# Patient Record
Sex: Female | Born: 1965 | Race: Black or African American | Hispanic: No | Marital: Single | Smoking: Never smoker
Health system: Southern US, Community
[De-identification: ages and names within clinical notes are randomized; demographics above are authoritative.]

## PROBLEM LIST (undated history)

## (undated) DIAGNOSIS — E119 Type 2 diabetes mellitus without complications: Secondary | ICD-10-CM

## (undated) DIAGNOSIS — I10 Essential (primary) hypertension: Secondary | ICD-10-CM

## (undated) DIAGNOSIS — Q858 Other phakomatoses, not elsewhere classified: Secondary | ICD-10-CM

## (undated) DIAGNOSIS — Q8589 Other phakomatoses, not elsewhere classified: Secondary | ICD-10-CM

## (undated) HISTORY — PX: ABDOMINAL HYSTERECTOMY: SHX81

## (undated) HISTORY — PX: MYOMECTOMY: SHX85

---

## 2002-05-21 ENCOUNTER — Emergency Department (HOSPITAL_COMMUNITY): Admission: EM | Admit: 2002-05-21 | Discharge: 2002-05-22 | Payer: Self-pay | Admitting: Emergency Medicine

## 2017-11-03 ENCOUNTER — Encounter (HOSPITAL_BASED_OUTPATIENT_CLINIC_OR_DEPARTMENT_OTHER): Payer: Self-pay | Admitting: *Deleted

## 2017-11-03 ENCOUNTER — Emergency Department (HOSPITAL_BASED_OUTPATIENT_CLINIC_OR_DEPARTMENT_OTHER)
Admission: EM | Admit: 2017-11-03 | Discharge: 2017-11-03 | Disposition: A | Discharge status: Home / Self Care | Payer: BLUE CROSS/BLUE SHIELD | Attending: Emergency Medicine | Admitting: Emergency Medicine

## 2017-11-03 ENCOUNTER — Other Ambulatory Visit: Payer: Self-pay

## 2017-11-03 ENCOUNTER — Emergency Department (HOSPITAL_BASED_OUTPATIENT_CLINIC_OR_DEPARTMENT_OTHER): Payer: BLUE CROSS/BLUE SHIELD

## 2017-11-03 DIAGNOSIS — I10 Essential (primary) hypertension: Secondary | ICD-10-CM | POA: Diagnosis not present

## 2017-11-03 DIAGNOSIS — R6889 Other general symptoms and signs: Secondary | ICD-10-CM

## 2017-11-03 DIAGNOSIS — J111 Influenza due to unidentified influenza virus with other respiratory manifestations: Secondary | ICD-10-CM | POA: Insufficient documentation

## 2017-11-03 DIAGNOSIS — E119 Type 2 diabetes mellitus without complications: Secondary | ICD-10-CM | POA: Insufficient documentation

## 2017-11-03 DIAGNOSIS — R5383 Other fatigue: Secondary | ICD-10-CM | POA: Diagnosis present

## 2017-11-03 DIAGNOSIS — E876 Hypokalemia: Secondary | ICD-10-CM

## 2017-11-03 HISTORY — DX: Essential (primary) hypertension: I10

## 2017-11-03 HISTORY — DX: Other phakomatoses, not elsewhere classified: Q85.89

## 2017-11-03 HISTORY — DX: Type 2 diabetes mellitus without complications: E11.9

## 2017-11-03 HISTORY — DX: Other phakomatoses, not elsewhere classified: Q85.8

## 2017-11-03 LAB — BASIC METABOLIC PANEL
ANION GAP: 10 (ref 5–15)
BUN: 20 mg/dL (ref 6–20)
CALCIUM: 8.7 mg/dL — AB (ref 8.9–10.3)
CO2: 25 mmol/L (ref 22–32)
Chloride: 103 mmol/L (ref 101–111)
Creatinine, Ser: 1.29 mg/dL — ABNORMAL HIGH (ref 0.44–1.00)
GFR calc Af Amer: 54 mL/min — ABNORMAL LOW (ref >60)
GFR, EST NON AFRICAN AMERICAN: 47 mL/min — AB (ref >60)
GLUCOSE: 117 mg/dL — AB (ref 65–99)
Potassium: 2.9 mmol/L — ABNORMAL LOW (ref 3.5–5.1)
SODIUM: 138 mmol/L (ref 135–145)

## 2017-11-03 LAB — CBC WITH DIFFERENTIAL/PLATELET
BASOS ABS: 0 10*3/uL (ref 0.0–0.1)
Basophils Relative: 0 %
EOS ABS: 0.4 10*3/uL (ref 0.0–0.7)
Eosinophils Relative: 5 %
HCT: 37.2 % (ref 36.0–46.0)
Hemoglobin: 12.3 g/dL (ref 12.0–15.0)
LYMPHS ABS: 3.4 10*3/uL (ref 0.7–4.0)
Lymphocytes Relative: 49 %
MCH: 27 pg (ref 26.0–34.0)
MCHC: 33.1 g/dL (ref 30.0–36.0)
MCV: 81.8 fL (ref 78.0–100.0)
Monocytes Absolute: 0.7 10*3/uL (ref 0.1–1.0)
Monocytes Relative: 10 %
NEUTROS ABS: 2.5 10*3/uL (ref 1.7–7.7)
Neutrophils Relative %: 36 %
PLATELETS: 292 10*3/uL (ref 150–400)
RBC: 4.55 MIL/uL (ref 3.87–5.11)
RDW: 14.1 % (ref 11.5–15.5)
WBC: 7 10*3/uL (ref 4.0–10.5)

## 2017-11-03 LAB — RAPID STREP SCREEN (MED CTR MEBANE ONLY): STREPTOCOCCUS, GROUP A SCREEN (DIRECT): NEGATIVE

## 2017-11-03 LAB — CBG MONITORING, ED: GLUCOSE-CAPILLARY: 188 mg/dL — AB (ref 65–99)

## 2017-11-03 MED ORDER — DM-GUAIFENESIN ER 30-600 MG PO TB12: 1.0000 | ORAL_TABLET | Freq: Two times a day (BID) | ORAL | 1 refills | Status: AC

## 2017-11-03 MED ORDER — SODIUM CHLORIDE 0.9 % IV BOLUS (SEPSIS)
1000.0000 mL | Freq: Once | INTRAVENOUS | Status: AC
  Administered 2017-11-03: 1000 mL via INTRAVENOUS

## 2017-11-03 MED ORDER — SODIUM CHLORIDE 0.9 % IV SOLN: INTRAVENOUS | Status: DC

## 2017-11-03 MED ORDER — POTASSIUM CHLORIDE CRYS ER 20 MEQ PO TBCR
40.0000 meq | EXTENDED_RELEASE_TABLET | Freq: Once | ORAL | Status: AC
  Administered 2017-11-03: 40 meq via ORAL
  Filled 2017-11-03: qty 2

## 2017-11-03 NOTE — ED Provider Notes (Signed)
[facility] EMERGENCY DEPARTMENT Provider Note   CSN: [id number] Arrival date & time: 11/03/17  1555     History   Chief Complaint Chief Complaint  Patient presents with  . Fatigue    HPI Autumn Kim is a 52 y.o. female.  Patient with a history of flulike illness for 1 week.  Patient has cough nonproductive sore throat.  No fever persistent nausea no vomiting no diarrhea.  Patient had sick exposure with a young child on New Year's Day.  Patient stated in triage that she is post take 4 medications for diabetes and she ran out of to the medications 2 weeks ago.  She feels jittery.  Patient's main complaint when I saw her really was the concern for the upper respiratory infection.  She feels like she should be coughing up phlegm but it disorder rattles around in her chest.  She is also worried about pneumonia and worried about strep throat.      Past Medical History:  Diagnosis Date  . Diabetes mellitus without complication (HCC)   . Hypertension   . Peutz-Jeghers syndrome (HCC)     There are no active problems to display for this patient.   Past Surgical History:  Procedure Laterality Date  . ABDOMINAL HYSTERECTOMY    . MYOMECTOMY      OB History    No data available       Home Medications    Prior to Admission medications   Medication Sig Start Date End Date Taking? Authorizing Provider  dextromethorphan-guaiFENesin (MUCINEX DM) 30-600 MG 12hr tablet Take 1 tablet by mouth 2 (two) times daily. 11/03/17   Vanetta MuldersZackowski, Baron Parmelee, MD    Family History No family history on file.  Social History Social History   Tobacco Use  . Smoking status: Never Smoker  . Smokeless tobacco: Never Used  Substance Use Topics  . Alcohol use: No    Frequency: Never  . Drug use: No     Allergies   Flagyl [metronidazole]; Penicillins; and Septra [sulfamethoxazole-trimethoprim]   Review of Systems Review of Systems  Constitutional: Positive for fatigue. Negative  for fever.  HENT: Positive for congestion.   Eyes: Negative for redness.  Respiratory: Positive for cough.   Gastrointestinal: Positive for nausea. Negative for abdominal pain, diarrhea and vomiting.  Genitourinary: Positive for dysuria.  Musculoskeletal: Negative for myalgias.  Skin: Negative for rash.  Neurological: Negative for headaches.  Hematological: Does not bruise/bleed easily.  Psychiatric/Behavioral: Negative for confusion.     Physical Exam Updated Vital Signs BP 136/76 (BP Location: Right Arm)   Pulse (!) 58   Temp 98.2 F (36.8 C) (Oral)   Resp 16   Ht 1.613 m (5' 3.5")   Wt 90.7 kg (200 lb)   SpO2 99%   BMI 34.87 kg/m   Physical Exam  Constitutional: She is oriented to person, place, and time. She appears well-developed and well-nourished. No distress.  HENT:  Head: Normocephalic and atraumatic.  Mucous membranes dry  Eyes: Conjunctivae and EOM are normal. Pupils are equal, round, and reactive to light.  Neck: Normal range of motion. Neck supple.  Cardiovascular: Normal rate, regular rhythm and normal heart sounds.  Pulmonary/Chest: Effort normal and breath sounds normal. No respiratory distress. She has no wheezes. She has no rales.  Abdominal: Soft. Bowel sounds are normal. There is no tenderness.  Musculoskeletal: Normal range of motion.  Neurological: She is alert and oriented to person, place, and time. No cranial nerve deficit or sensory deficit.  She exhibits normal muscle tone. Coordination normal.  Skin: Skin is warm.  Nursing note and vitals reviewed.    ED Treatments / Results  Labs (all labs ordered are listed, but only abnormal results are displayed) Labs Reviewed  BASIC METABOLIC PANEL - Abnormal; Notable for the following components:      Result Value   Potassium 2.9 (*)    Glucose, Bld 117 (*)    Creatinine, Ser 1.29 (*)    Calcium 8.7 (*)    GFR calc non Af Amer 47 (*)    GFR calc Af Amer 54 (*)    All other components within  normal limits  CBG MONITORING, ED - Abnormal; Notable for the following components:   Glucose-Capillary 188 (*)    All other components within normal limits  RAPID STREP SCREEN (NOT AT [facility])  CULTURE, GROUP A STREP ([facility])  CBC WITH DIFFERENTIAL/PLATELET  CBC WITH DIFFERENTIAL/PLATELET    EKG  EKG Interpretation None       Radiology Dg Chest 2 View  Result Date: 11/03/2017 CLINICAL DATA:  Cough and congestion x1 week EXAM: CHEST  2 VIEW COMPARISON:  07/07/2015 FINDINGS: The heart size and mediastinal contours are within normal limits. Both lungs are clear. Mild central vascular congestion is seen. Lung volumes are slightly low. This contributes to mild crowding of central interstitial lung markings. The visualized skeletal structures are unremarkable. IMPRESSION: Slightly low lung volumes relative to prior exam accounting for crowding of interstitial lung markings. Mild central vascular congestion is noted without active pulmonary disease. Electronically Signed   By: Tollie Eth M.D.   On: 11/03/2017 18:28    Procedures Procedures (including critical care time)  Medications Ordered in ED Medications  0.9 %  sodium chloride infusion (not administered)  sodium chloride 0.9 % bolus 1,000 mL (0 mLs Intravenous Stopped 11/03/17 2211)  potassium chloride SA (K-DUR,KLOR-CON) CR tablet 40 mEq (40 mEq Oral Given 11/03/17 2223)     Initial Impression / Assessment and Plan / ED Course  I have reviewed the triage vital signs and the nursing notes.  Pertinent labs & imaging results that were available during my care of the patient were reviewed by me and considered in my medical decision making (see chart for details).    Rapid strep was negative chest x-ray negative for pneumonia.  Labs without significant abnormalities other than a potassium of 2.9.  Patient states she has had low potassiums in the past.  Blood sugar reasonably controlled at 117.  Patient treated here with some IV fluids.   Patient without any indication for antibiotics.  Patient did have the flu shot.  So this may very well just be an upper respiratory viral infection.  Patient will be treated with Mucinex for the congestion and cough.  She will follow-up with her doctors regarding the low potassium.  Patient did receive 40 mEq of potassium p.o.   Final Clinical Impressions(s) / ED Diagnoses   Final diagnoses:  Flu-like symptoms  Hypokalemia    ED Discharge Orders        Ordered    dextromethorphan-guaiFENesin [facility] DM) 30-600 MG 12hr tablet  2 times daily     11/03/17 2224       Vanetta Mulders, MD 11/03/17 2250

## 2017-11-03 NOTE — ED Notes (Signed)
Pt reports sore throat, cough, congestion x 1 week.

## 2017-11-03 NOTE — Discharge Instructions (Signed)
Make an appointment to follow-up with your doctor.  Potassium was low here today we gave you some oral potassium.  Have it rechecked.  Take Mucinex DM for the cough and congestion in the chest.  Return for any new or worse symptoms.  Chest x-ray negative for pneumonia.  Strep test negative for strep throat.  Other labs without significant abnormal.

## 2017-11-03 NOTE — ED Triage Notes (Signed)
Cold for a week. She is suppose to take 4 medications for her diabetes and ran out of 2 of the medications 2 weeks ago. She feels jittery.

## 2017-11-06 LAB — CULTURE, GROUP A STREP (THRC)

## 2019-01-21 IMAGING — CR DG CHEST 2V
2 series · 2 of 2 positions shown · non-contrast
Comparison: 07/07/2015

CLINICAL DATA: Cough and congestion x1 week

EXAM:
CHEST  2 VIEW

[w chest pa]
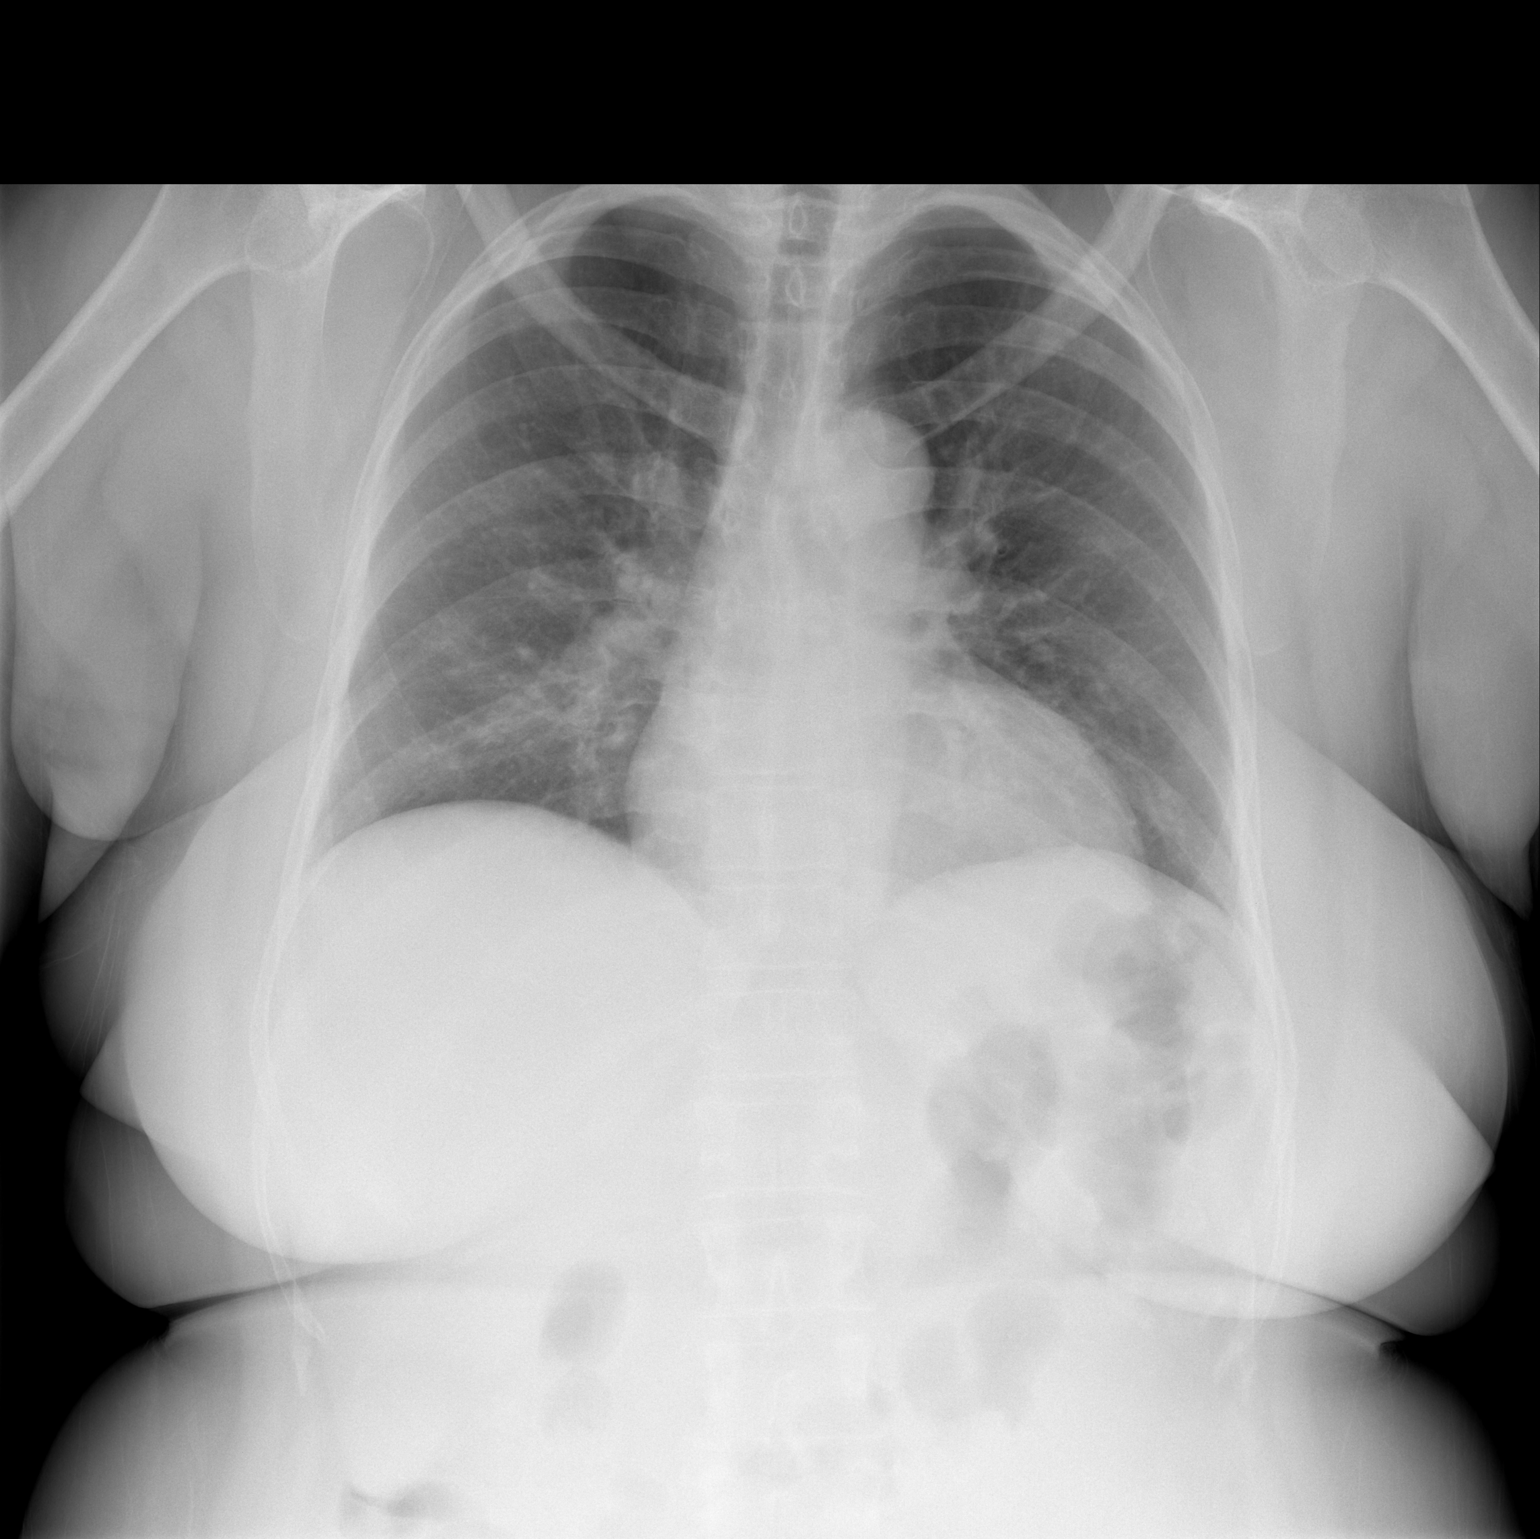

[w chest lat]
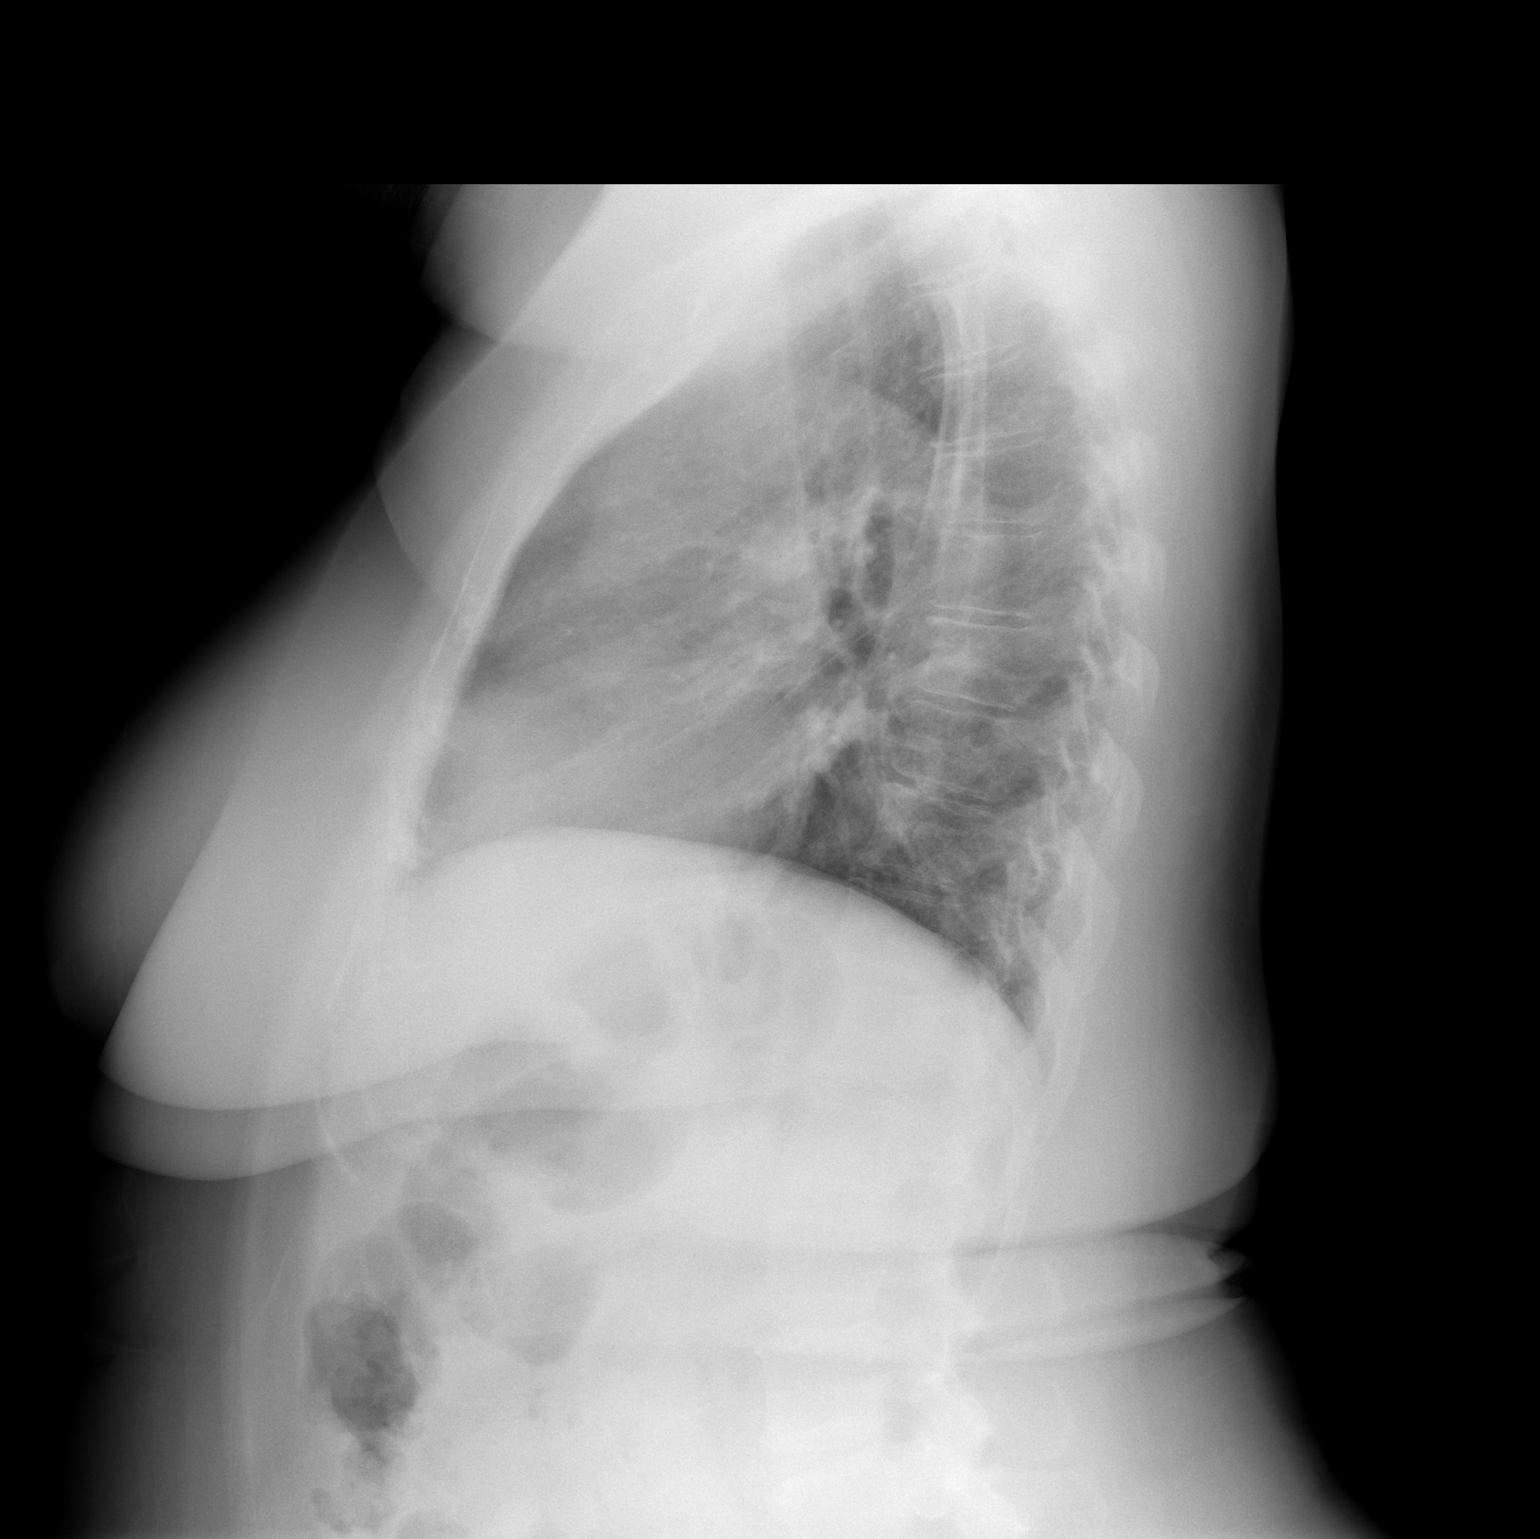

[2 of 2 positions shown; findings below may reference images not displayed]

FINDINGS: The heart size and mediastinal contours are within normal limits.
Both lungs are clear. Mild central vascular congestion is seen. Lung
volumes are slightly low. This contributes to mild crowding of
central interstitial lung markings. The visualized skeletal
structures are unremarkable.
IMPRESSION: Slightly low lung volumes relative to prior exam accounting for
crowding of interstitial lung markings. Mild central vascular
congestion is noted without active pulmonary disease.
